# Patient Record
Sex: Male | Born: 1995
Health system: Southern US, Community
[De-identification: ages and names within clinical notes are randomized; demographics above are authoritative.]

## PROBLEM LIST (undated history)

## (undated) DIAGNOSIS — F909 Attention-deficit hyperactivity disorder, unspecified type: Secondary | ICD-10-CM

## (undated) HISTORY — DX: Attention-deficit hyperactivity disorder, unspecified type: F90.9

---
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
## 2014-01-04 ENCOUNTER — Telehealth: Payer: Self-pay | Admitting: Nurse Practitioner

## 2014-01-04 ENCOUNTER — Encounter: Payer: Self-pay | Admitting: *Deleted

## 2014-01-04 NOTE — Telephone Encounter (Signed)
Spoke with pt's mother regarding RX Verbalizes understanding

## 2014-01-31 ENCOUNTER — Telehealth: Payer: Self-pay | Admitting: Nurse Practitioner

## 2014-01-31 DIAGNOSIS — F988 Other specified behavioral and emotional disorders with onset usually occurring in childhood and adolescence: Secondary | ICD-10-CM

## 2014-02-01 MED ORDER — LISDEXAMFETAMINE DIMESYLATE 60 MG PO CAPS: 60.0000 mg | ORAL_CAPSULE | ORAL | Status: DC

## 2014-02-01 NOTE — Telephone Encounter (Signed)
vyvanse rx ready for pick up  

## 2014-02-01 NOTE — Telephone Encounter (Signed)
Pt aware.

## 2014-03-04 ENCOUNTER — Telehealth: Payer: Self-pay | Admitting: Nurse Practitioner

## 2014-03-04 DIAGNOSIS — F988 Other specified behavioral and emotional disorders with onset usually occurring in childhood and adolescence: Secondary | ICD-10-CM

## 2014-03-04 MED ORDER — LISDEXAMFETAMINE DIMESYLATE 60 MG PO CAPS: 60.0000 mg | ORAL_CAPSULE | ORAL | Status: DC

## 2014-03-04 NOTE — Telephone Encounter (Signed)
vyvanse rx ready for pick up  

## 2014-03-04 NOTE — Telephone Encounter (Signed)
Pt aware.

## 2014-03-05 ENCOUNTER — Telehealth: Payer: Self-pay | Admitting: Nurse Practitioner

## 2014-03-05 NOTE — Telephone Encounter (Signed)
Would rather not do adderall- would rather try concerta

## 2014-03-05 NOTE — Telephone Encounter (Signed)
Insurance plan changed and vyvance cost $235.  It had been $35.00 before.    Could you change to generic Adderall or anything that would work but not cost so much.   We would appreciate your help.  ([facility])

## 2014-03-06 MED ORDER — METHYLPHENIDATE HCL ER (OSM) 54 MG PO TBCR: 54.0000 mg | EXTENDED_RELEASE_TABLET | ORAL | Status: DC

## 2014-03-06 NOTE — Telephone Encounter (Signed)
Concerta should be fine. Can you write prescription?

## 2014-03-06 NOTE — Telephone Encounter (Signed)
concerta rx ready for pick up  

## 2014-03-29 ENCOUNTER — Ambulatory Visit (INDEPENDENT_AMBULATORY_CARE_PROVIDER_SITE_OTHER): Payer: BLUE CROSS/BLUE SHIELD | Admitting: Nurse Practitioner

## 2014-03-29 ENCOUNTER — Encounter: Payer: Self-pay | Admitting: Nurse Practitioner

## 2014-03-29 VITALS — BP 134/75 | HR 84 | Temp 98.0°F | Ht 71.0 in | Wt 207.0 lb

## 2014-03-29 DIAGNOSIS — J0120 Acute ethmoidal sinusitis, unspecified: Secondary | ICD-10-CM

## 2014-03-29 MED ORDER — AZITHROMYCIN 250 MG PO TABS: ORAL_TABLET | ORAL | Status: DC

## 2014-03-29 NOTE — Patient Instructions (Addendum)

## 2014-03-29 NOTE — Progress Notes (Signed)
   Subjective:    Patient ID: Laqueta DueBenjamin R Gillen, male    DOB: 06-Feb-1996, 19 y.o.   MRN: [id number]  Sinus Problem This is a new problem. The current episode started in the past 7 days. The problem has been gradually worsening since onset. There has been no fever. Pain severity now: pressure over the sinuses.  Associated symptoms include congestion, coughing, sinus pressure and a sore throat. Treatments tried: NYQUILL, DAYQUILL, zyrtec D.  The treatment provided no relief.  Cough Associated symptoms include postnasal drip, rhinorrhea and a sore throat.     Review of Systems  Constitutional: Negative.   HENT: Positive for congestion, postnasal drip, rhinorrhea, sinus pressure and sore throat.   Eyes: Negative.   Respiratory: Positive for cough.   Endocrine: Negative.   Genitourinary: Negative.   Musculoskeletal: Negative.   Allergic/Immunologic: Negative.   Neurological: Negative.   Hematological: Negative.   Psychiatric/Behavioral: Negative.        Objective:   Physical Exam  Constitutional: He is oriented to person, place, and time. He appears well-developed and well-nourished.  HENT:  Head: Normocephalic.  Eyes: Pupils are equal, round, and reactive to light.  Neck: Normal range of motion.  Cardiovascular: Normal rate and regular rhythm.   Pulmonary/Chest: Effort normal and breath sounds normal.  Musculoskeletal: Normal range of motion.  Neurological: He is alert and oriented to person, place, and time.  Skin: Skin is warm.  Psychiatric: He has a normal mood and affect.    BP 134/75 mmHg  Pulse 84  Temp(Src) 98 F (36.7 C) (Oral)  Ht 5\' 11"  (1.803 m)  Wt 207 lb (93.895 kg)  BMI 28.88 kg/m2       Assessment & Plan:   Acute ethmoidal sinusitis, recurrence not specified - Plan: azithromycin (ZITHROMAX) 250 MG tablet  Meds ordered this encounter  Medications  . azithromycin (ZITHROMAX) 250 MG tablet    Sig: USE AS DIRECTED    Dispense:  6 tablet    Refill:   0     1. Take meds as prescribed 2. Use a cool mist humidifier especially during the winter months and when heat has been humid. 3. Use saline nose sprays frequently 4. Saline irrigations of the nose can be very helpful if done frequently.  * 4X daily for 1 week*  * Use of a nettie pot can be helpful with this. Follow directions with this* 5. Drink plenty of fluids 6. Keep thermostat turn down low 7.For any cough or congestion  Use plain Mucinex- regular strength or max strength is fine   * Children- consult with Pharmacist for dosing 8. For fever or aces or pains- take tylenol or ibuprofen appropriate for age and weight.  * for fevers greater than 101 orally you may alternate ibuprofen and tylenol every  3 hours.    Mary-Margaret Daphine DeutscherMartin, FNP

## 2014-10-21 ENCOUNTER — Telehealth: Payer: Self-pay | Admitting: Nurse Practitioner

## 2014-10-21 NOTE — Telephone Encounter (Signed)
Mother aware that we do not have any record of tdap.

## 2014-10-25 ENCOUNTER — Encounter: Payer: Self-pay | Admitting: Family Medicine

## 2014-10-25 ENCOUNTER — Ambulatory Visit (INDEPENDENT_AMBULATORY_CARE_PROVIDER_SITE_OTHER): Payer: BLUE CROSS/BLUE SHIELD | Admitting: Family Medicine

## 2014-10-25 VITALS — BP 126/67 | HR 96 | Temp 97.8°F | Ht 71.0 in | Wt 185.4 lb

## 2014-10-25 DIAGNOSIS — Z23 Encounter for immunization: Secondary | ICD-10-CM | POA: Diagnosis not present

## 2014-10-25 DIAGNOSIS — F32A Depression, unspecified: Secondary | ICD-10-CM

## 2014-10-25 DIAGNOSIS — F329 Major depressive disorder, single episode, unspecified: Secondary | ICD-10-CM

## 2014-10-25 DIAGNOSIS — F339 Major depressive disorder, recurrent, unspecified: Secondary | ICD-10-CM | POA: Insufficient documentation

## 2014-10-25 MED ORDER — SERTRALINE HCL 50 MG PO TABS: 50.0000 mg | ORAL_TABLET | Freq: Every day | ORAL | Status: DC

## 2014-10-25 NOTE — Progress Notes (Signed)
BP 126/67 mmHg  Pulse 96  Temp(Src) 97.8 F (36.6 C) (Oral)  Ht  (1.803 m)  Wt 185 lb 6.4 oz (84.097 kg)  BMI 25.87 kg/m2   Subjective:    Patient ID: Marco George, male    DOB: August 31, 1995, 19 y.o.   MRN: [id number]  HPI: Marco George is a 19 y.o. male presenting on 10/25/2014 for Depression   HPI Anxiety/depression Patient presents today with feelings of depression and sadness and difficulty concentrating but have been increasing since he started. Sleep Issues:  Yes Interests and hobbies decrease: Yes Guilt:  Yes Energy Loss:  Yes Concentration difficulties:  Yes Appetite changes:  Yes Psychomotor Retardation:  Yes Suicidal Ideations:  No  Has there ever been a period of time when you were not your usual self and ... - you felt so good or so hyper that other people thought you were not your normal self or you were so hyper that you got into trouble?  No - you were so irritable that you shouted at people or started fights or arguments? No - you felt much more self-confident than usual? No - you got much less sleep than usual and found that you didn't really miss it? No - you were more talkative or spoke much faster than usual? No - thoughts raced through your head or you couldn't slow your mind down? Yes - you were so easily distracted by things around you that you had trouble concentrating or stay on track? Yes -you had much more energy than usual? No - you were much more active or did many more things than usual? No - you were much more social or outgoing than usual, for example, you telephoned friends in the middle of the night? No -you were much more interested in sex than usual? No -you did things that were unusual for you or that other people might have thought were excessive, foolish or risky? No -spending money got you or your family in trouble? No    If you checked yes for more than one of the above, have several of these ever happened during  the same period of time? No  How much of a problem did any of these cause you- like being able to work; having family, money or legal troubles; getting in arguments or fights? No major issues with these problems.  Have any of your blood relatives had manic-depressive illness or bipolar disorder?  No   Has a health professional ever told you that you have manic-depressive illness or bipolar disorder? No                               Relevant past medical, surgical, family and social history reviewed and updated as indicated. Interim medical history since our last visit reviewed. Allergies and medications reviewed and updated.  Review of Systems  Constitutional: Negative for fever and chills.  HENT: Negative for ear discharge and ear pain.   Eyes: Negative for discharge and visual disturbance.  Respiratory: Negative for shortness of breath and wheezing.   Cardiovascular: Negative for chest pain, palpitations and leg swelling.  Gastrointestinal: Negative for abdominal pain, diarrhea and constipation.  Genitourinary: Negative for difficulty urinating.  Musculoskeletal: Negative for back pain and gait problem.  Skin: Negative for rash.  Neurological: Negative for dizziness, syncope, weakness, light-headedness and headaches.  Psychiatric/Behavioral: Positive for dysphoric mood, decreased concentration and agitation. Negative for suicidal ideas,  hallucinations and confusion. The patient is nervous/anxious.   All other systems reviewed and are negative.   Per HPI unless specifically indicated above     Medication List       This list is accurate as of: 10/25/14 10:23 AM.  Always use your most recent med list.               sertraline 50 MG tablet  Commonly known as:  ZOLOFT  Take 1 tablet (50 mg total) by mouth daily.           Objective:    BP 126/67 mmHg  Pulse 96  Temp(Src) 97.8 F (36.6 C) (Oral)  Ht 5\' 11"  (1.803 m)  Wt 185 lb 6.4 oz (84.097 kg)  BMI 25.87 kg/m2    Wt Readings from Last 3 Encounters:  10/25/14 185 lb 6.4 oz (84.097 kg)  03/29/14 207 lb (93.895 kg) (94 %*, Z = 1.56)  12/07/13 187 lb 12.8 oz (85.186 kg) (87 %*, Z = 1.11)   * Growth percentiles are based on CDC 2-20 Years data.    Physical Exam  Constitutional: He is oriented to person, place, and time. He appears well-developed and well-nourished. No distress.  Eyes: Conjunctivae and EOM are normal. Pupils are equal, round, and reactive to light. Right eye exhibits no discharge. No scleral icterus.  Neck: Neck supple. No thyromegaly present.  Cardiovascular: Normal rate, regular rhythm, normal heart sounds and intact distal pulses.   No murmur heard. Pulmonary/Chest: Effort normal and breath sounds normal. No respiratory distress. He has no wheezes.  Abdominal: He exhibits no distension.  Musculoskeletal: Normal range of motion. He exhibits no edema.  Lymphadenopathy:    He has no cervical adenopathy.  Neurological: He is alert and oriented to person, place, and time. Coordination normal.  Skin: Skin is warm and dry. No rash noted. He is not diaphoretic.  Psychiatric: His speech is normal and behavior is normal. Judgment and thought content normal. His mood appears anxious. His affect is labile. His affect is not angry, not blunt and not inappropriate. Cognition and memory are normal. He exhibits a depressed mood. He expresses no suicidal ideation. He expresses no suicidal plans.  Vitals reviewed.   Results for orders placed or performed during the hospital encounter of 09/30/06  Rapid strep screen  Result Value Ref Range   Streptococcus, Group A Screen (Direct) NEGATIVE   Urinalysis, Routine w reflex microscopic  Result Value Ref Range   Color, Urine YELLOW    APPearance CLEAR    Specific Gravity, Urine 1.024    pH 6.0    Glucose, UA NEGATIVE    Hgb urine dipstick NEGATIVE    Bilirubin Urine NEGATIVE    Ketones, ur NEGATIVE    Protein, ur >300 (A)    Urobilinogen, UA 0.2     Nitrite NEGATIVE    Leukocytes, UA NEGATIVE   Urine microscopic-add on  Result Value Ref Range   WBC, UA 0-2    RBC / HPF 0-2    Bacteria, UA RARE    Casts HYALINE CASTS GRANULAR CAST (A)    Urine-Other MUCOUS PRESENT   CBC  Result Value Ref Range   WBC 9.1    RBC 5.67 (H)    Hemoglobin 16.5 (H)    HCT 47.1 (H)    MCV 83.1    MCHC 35.0 (H)    RDW 12.1    Platelets 351   Comprehensive metabolic panel  Result Value Ref Range  Sodium 137    Potassium 3.6    Chloride 100    CO2 26    Glucose, Bld 93    BUN 12    Creatinine, Ser 0.67    Calcium 10.3    Total Protein 8.2    Albumin 4.6    AST 25    ALT 26    Alkaline Phosphatase 214    Total Bilirubin 0.7    GFR calc non Af Amer NOT CALCULATED    GFR calc Af Amer      NOT CALCULATED        The eGFR has been calculated using the MDRD equation. This calculation has not been validated in all clinical  Differential  Result Value Ref Range   Neutrophils Relative % 63    Neutro Abs 5.7    Lymphocytes Relative 26 (L)    Lymphs Abs 2.3    Monocytes Relative 10 (H)    Monocytes Absolute 0.9    Eosinophils Relative 0    Eosinophils Absolute 0.0    Basophils Relative 0    Basophils Absolute 0.0       Assessment & Plan:   Problem List Items Addressed This Visit      Other   Acute depression - Primary    Patient has acute depression or stress reaction or adjustment disorder. We will treat with Zoloft and recommended him to see a counselor which she is restarted to see down in [place]. Gave number for suicide crisis line here in [facility] and recommended her him to find the same number up in [facility] or call the national hotline which is online.      Relevant Medications   sertraline (ZOLOFT) 50 MG tablet   Other Relevant Orders   CBC with Differential/Platelet   Thyroid Panel With TSH (Completed)   Vit D  25 hydroxy (rtn osteoporosis monitoring) (Completed)       Follow up plan: Return in about 4  weeks (around 11/22/2014), or if symptoms worsen or fail to improve.  Caryl Pina, MD [facility] 10/25/2014, 10:23 AM

## 2014-10-25 NOTE — Assessment & Plan Note (Signed)
Patient has acute depression or stress reaction or adjustment disorder. We will treat with Zoloft and recommended him to see a counselor which she is restarted to see down in [place]. Gave number for suicide crisis line here in [facility] and recommended her him to find the same number up in [facility] or call the national hotline which is online.

## 2014-10-25 NOTE — Patient Instructions (Signed)

## 2014-10-26 LAB — CBC WITH DIFFERENTIAL/PLATELET
BASOS ABS: 0 10*3/uL (ref 0.0–0.2)
BASOS: 1 %
EOS (ABSOLUTE): 0 10*3/uL (ref 0.0–0.4)
Eos: 0 %
HEMOGLOBIN: 15.8 g/dL (ref 12.6–17.7)
Hematocrit: 46 % (ref 37.5–51.0)
IMMATURE GRANS (ABS): 0 10*3/uL (ref 0.0–0.1)
Immature Granulocytes: 0 %
LYMPHS: 30 %
Lymphocytes Absolute: 2.5 10*3/uL (ref 0.7–3.1)
MCH: 29.8 pg (ref 26.6–33.0)
MCHC: 34.3 g/dL (ref 31.5–35.7)
MCV: 87 fL (ref 79–97)
MONOCYTES: 8 %
Monocytes Absolute: 0.6 10*3/uL (ref 0.1–0.9)
NEUTROS ABS: 5.1 10*3/uL (ref 1.4–7.0)
Neutrophils: 61 %
Platelets: 361 10*3/uL (ref 150–379)
RBC: 5.31 x10E6/uL (ref 4.14–5.80)
RDW: 12.7 % (ref 12.3–15.4)
WBC: 8.4 10*3/uL (ref 3.4–10.8)

## 2014-10-26 LAB — THYROID PANEL WITH TSH
Free Thyroxine Index: 2.6 (ref 1.2–4.9)
T3 Uptake Ratio: 29 % (ref 24–39)
T4, Total: 9 ug/dL (ref 4.5–12.0)
TSH: 1.71 u[IU]/mL (ref 0.450–4.500)

## 2014-10-26 LAB — VITAMIN D 25 HYDROXY (VIT D DEFICIENCY, FRACTURES): VIT D 25 HYDROXY: 38.4 ng/mL (ref 30.0–100.0)

## 2014-11-15 ENCOUNTER — Encounter: Payer: Self-pay | Admitting: Family Medicine

## 2014-11-15 ENCOUNTER — Ambulatory Visit (INDEPENDENT_AMBULATORY_CARE_PROVIDER_SITE_OTHER): Payer: BLUE CROSS/BLUE SHIELD | Admitting: Family Medicine

## 2014-11-15 VITALS — BP 122/75 | HR 84 | Temp 97.5°F | Ht 71.0 in | Wt 198.0 lb

## 2014-11-15 DIAGNOSIS — F329 Major depressive disorder, single episode, unspecified: Secondary | ICD-10-CM | POA: Diagnosis not present

## 2014-11-15 DIAGNOSIS — F32A Depression, unspecified: Secondary | ICD-10-CM

## 2014-11-15 MED ORDER — SERTRALINE HCL 50 MG PO TABS
50.0000 mg | ORAL_TABLET | Freq: Every day | ORAL | Status: DC
Start: 2014-11-15 — End: 2016-09-08

## 2014-11-15 NOTE — Progress Notes (Signed)
BP 122/75 mmHg  Pulse 84  Temp(Src) 97.5 F (36.4 C) (Oral)  Ht  (1.803 m)  Wt 198 lb (89.812 kg)  BMI 27.63 kg/m2   Subjective:    Patient ID: Marco George, male    DOB: May 02, 1995, 19 y.o.   MRN: [id number]  HPI: Marco George is a 19 y.o. male presenting on 11/15/2014 for Depression   HPI Depression Patient presents for a depression recheck. He said it took about 2 weeks but the medication has been doing great for him. He feels like he can get out and exercise and is doing a lot better in school. He also feels like he is able to get out and make friends better. His mother is also with him today and she said it took about 2 weeks but it's night and day that he is feeling so much better. He does feel like his appetite is increased and we discussed that happening with the Zoloft and that he just needs to watch that. He describes his emotions as 8 out of 10 and how good he is feeling.  Relevant past medical, surgical, family and social history reviewed and updated as indicated. Interim medical history since our last visit reviewed. Allergies and medications reviewed and updated.  Review of Systems  Constitutional: Negative for fever and chills.  HENT: Negative for ear discharge and ear pain.   Eyes: Negative for discharge and visual disturbance.  Respiratory: Negative for shortness of breath and wheezing.   Cardiovascular: Negative for chest pain, palpitations and leg swelling.  Gastrointestinal: Negative for abdominal pain, diarrhea and constipation.  Genitourinary: Negative for difficulty urinating.  Musculoskeletal: Negative for back pain and gait problem.  Skin: Negative for rash.  Neurological: Negative for dizziness, syncope, weakness, light-headedness and headaches.  Psychiatric/Behavioral: Positive for dysphoric mood (much improved). Negative for suicidal ideas, hallucinations, confusion, decreased concentration and agitation. The patient is not  nervous/anxious.   All other systems reviewed and are negative.   Per HPI unless specifically indicated above     Medication List       This list is accurate as of: 11/15/14 11:39 AM.  Always use your most recent med list.               sertraline 50 MG tablet  Commonly known as:  ZOLOFT  Take 1 tablet (50 mg total) by mouth daily.           Objective:    BP 122/75 mmHg  Pulse 84  Temp(Src) 97.5 F (36.4 C) (Oral)  Ht  (1.803 m)  Wt 198 lb (89.812 kg)  BMI 27.63 kg/m2  Wt Readings from Last 3 Encounters:  11/15/14 198 lb (89.812 kg)  10/25/14 185 lb 6.4 oz (84.097 kg)  03/29/14 207 lb (93.895 kg) (94 %*, Z = 1.56)   * Growth percentiles are based on CDC 2-20 Years data.    Physical Exam  Constitutional: He is oriented to person, place, and time. He appears well-developed and well-nourished. No distress.  Eyes: Conjunctivae and EOM are normal. Pupils are equal, round, and reactive to light. Right eye exhibits no discharge. No scleral icterus.  Cardiovascular: Normal rate, regular rhythm, normal heart sounds and intact distal pulses.   No murmur heard. Pulmonary/Chest: Effort normal and breath sounds normal. No respiratory distress. He has no wheezes.  Abdominal: He exhibits no distension.  Musculoskeletal: Normal range of motion. He exhibits no edema.  Neurological: He is alert and oriented to person,  place, and time. Coordination normal.  Skin: Skin is warm and dry. No rash noted. He is not diaphoretic.  Psychiatric: His speech is normal and behavior is normal. Judgment and thought content normal. His mood appears anxious (Much improved). His affect is not blunt and not labile. Cognition and memory are normal. He exhibits a depressed mood (much improved). He expresses no suicidal ideation. He expresses no suicidal plans.  Vitals reviewed.   Results for orders placed or performed in visit on 10/25/14  Thyroid Panel With TSH  Result Value Ref Range   TSH  1.710 0.450 - 4.500 uIU/mL   T4, Total 9.0 4.5 - 12.0 ug/dL   T3 Uptake Ratio 29 24 - 39 %   Free Thyroxine Index 2.6 1.2 - 4.9  Vit D  25 hydroxy (rtn osteoporosis monitoring)  Result Value Ref Range   Vit D, 25-Hydroxy 38.4 30.0 - 100.0 ng/mL  CBC with Differential/Platelet  Result Value Ref Range   WBC 8.4 3.4 - 10.8 x10E3/uL   RBC 5.31 4.14 - 5.80 x10E6/uL   Hemoglobin 15.8 12.6 - 17.7 g/dL   Hematocrit 06.3 01.6 - 51.0 %   MCV 87 79 - 97 fL   MCH 29.8 26.6 - 33.0 pg   MCHC 34.3 31.5 - 35.7 g/dL   RDW 01.0 93.2 - 35.5 %   Platelets 361 150 - 379 x10E3/uL   Neutrophils 61 %   Lymphs 30 %   Monocytes 8 %   Eos 0 %   Basos 1 %   Neutrophils Absolute 5.1 1.4 - 7.0 x10E3/uL   Lymphocytes Absolute 2.5 0.7 - 3.1 x10E3/uL   Monocytes Absolute 0.6 0.1 - 0.9 x10E3/uL   EOS (ABSOLUTE) 0.0 0.0 - 0.4 x10E3/uL   Basophils Absolute 0.0 0.0 - 0.2 x10E3/uL   Immature Granulocytes 0 %   Immature Grans (Abs) 0.0 0.0 - 0.1 x10E3/uL      Assessment & Plan:   Problem List Items Addressed This Visit      Other   Acute depression - Primary    Depression recheck, is on the Zoloft 50 mg and feels like he is doing very well on it. He is doing a lot better in school and with friends and has started exercising. We'll continue medication and see him back in 4 months.      Relevant Medications   sertraline (ZOLOFT) 50 MG tablet       Follow up plan: Return in about 4 months (around 03/17/2015), or if symptoms worsen or fail to improve.  Arville Care, MD [facility] 11/15/2014, 11:39 AM

## 2014-11-15 NOTE — Patient Instructions (Signed)

## 2014-11-15 NOTE — Assessment & Plan Note (Signed)
Depression recheck, is on the Zoloft 50 mg and feels like he is doing very well on it. He is doing a lot better in school and with friends and has started exercising. We'll continue medication and see him back in 4 months.

## 2014-12-04 ENCOUNTER — Encounter: Payer: Self-pay | Admitting: *Deleted

## 2015-03-04 ENCOUNTER — Ambulatory Visit: Payer: BLUE CROSS/BLUE SHIELD | Admitting: Family Medicine

## 2015-07-20 DIAGNOSIS — T148 Other injury of unspecified body region: Secondary | ICD-10-CM | POA: Diagnosis not present

## 2015-07-20 DIAGNOSIS — S6992XA Unspecified injury of left wrist, hand and finger(s), initial encounter: Secondary | ICD-10-CM | POA: Diagnosis not present

## 2016-02-05 ENCOUNTER — Ambulatory Visit (INDEPENDENT_AMBULATORY_CARE_PROVIDER_SITE_OTHER): Payer: BLUE CROSS/BLUE SHIELD | Admitting: Family Medicine

## 2016-02-05 ENCOUNTER — Encounter: Payer: Self-pay | Admitting: Family Medicine

## 2016-02-05 VITALS — BP 101/74 | HR 98 | Temp 98.2°F | Ht 71.0 in | Wt 204.2 lb

## 2016-02-05 DIAGNOSIS — J019 Acute sinusitis, unspecified: Secondary | ICD-10-CM

## 2016-02-05 MED ORDER — AZITHROMYCIN 250 MG PO TABS: ORAL_TABLET | ORAL | 0 refills | Status: DC

## 2016-02-05 NOTE — Progress Notes (Signed)
BP 101/74   Pulse 98   Temp 98.2 F (36.8 C) (Oral)   Ht 5\' 11"  (1.803 m)   Wt 204 lb 4 oz (92.6 kg)   BMI 28.49 kg/m    Subjective:    Patient ID: Marco George, male    DOB: 1995/04/08, 20 y.o.   MRN: [id number]  HPI: Marco George is a 20 y.o. male presenting on 02/05/2016 for Sinusitis (sinus congestion, pressure and drainage; symptoms began 4 days ago; taking Mucinex, Zyrtec, Nyquil, Dayquil, Tyenol); Headache; and Cough   HPI Cough and congestion and sinus pressure and postnasal drainage Patient has been having cough and congestion and sinus drainage and postnasal drainage is been going on for the past 3 days. He has been using Zyrtec and Mucinex and DayQuil and Tylenol. He has cough that is productive of yellow-green sputum. He feels like he has had some low-grade fevers and has a sinus headache. He he thinks he may have had some wheezing but denies shortness of breath. He does not have a history of asthma. He feels like it has worsened over the past few days.  Relevant past medical, surgical, family and social history reviewed and updated as indicated. Interim medical history since our last visit reviewed. Allergies and medications reviewed and updated.  Review of Systems  Constitutional: Negative for chills and fever.  HENT: Positive for congestion, postnasal drip, rhinorrhea, sinus pressure, sneezing and sore throat. Negative for ear discharge, ear pain and voice change.   Eyes: Negative for pain, discharge, redness and visual disturbance.  Respiratory: Positive for cough. Negative for shortness of breath and wheezing.   Cardiovascular: Negative for chest pain and leg swelling.  Musculoskeletal: Negative for gait problem.  Skin: Negative for rash.  All other systems reviewed and are negative.   Per HPI unless specifically indicated above     Medication List       Accurate as of 02/05/16  8:44 AM. Always use your most recent med list.            azithromycin 250 MG tablet Commonly known as:  ZITHROMAX Take 2 the first day and then one each day after.   sertraline 50 MG tablet Commonly known as:  ZOLOFT Take 1 tablet (50 mg total) by mouth daily.          Objective:    BP 101/74   Pulse 98   Temp 98.2 F (36.8 C) (Oral)   Ht 5\' 11"  (1.803 m)   Wt 204 lb 4 oz (92.6 kg)   BMI 28.49 kg/m   Wt Readings from Last 3 Encounters:  02/05/16 204 lb 4 oz (92.6 kg)  11/15/14 198 lb (89.8 kg)  10/25/14 185 lb 6.4 oz (84.1 kg)    Physical Exam  Constitutional: He is oriented to person, place, and time. He appears well-developed and well-nourished. No distress.  HENT:  Right Ear: Tympanic membrane, external ear and ear canal normal.  Left Ear: Tympanic membrane, external ear and ear canal normal.  Nose: Mucosal edema and rhinorrhea present. No sinus tenderness. No epistaxis. Right sinus exhibits maxillary sinus tenderness and frontal sinus tenderness. Left sinus exhibits maxillary sinus tenderness and frontal sinus tenderness.  Mouth/Throat: Uvula is midline and mucous membranes are normal. Posterior oropharyngeal edema and posterior oropharyngeal erythema present. No oropharyngeal exudate or tonsillar abscesses.  Eyes: Conjunctivae are normal. Right eye exhibits no discharge. Left eye exhibits no discharge. No scleral icterus.  Neck: Neck supple. No thyromegaly present.  Cardiovascular: Normal rate, regular rhythm, normal heart sounds and intact distal pulses.   No murmur heard. Pulmonary/Chest: Effort normal and breath sounds normal. No respiratory distress. He has no wheezes. He has no rales.  Musculoskeletal: Normal range of motion. He exhibits no edema.  Lymphadenopathy:    He has no cervical adenopathy.  Neurological: He is alert and oriented to person, place, and time. Coordination normal.  Skin: Skin is warm and dry. No rash noted. He is not diaphoretic.  Psychiatric: He has a normal mood and affect. His behavior is  normal.  Nursing note and vitals reviewed.     Assessment & Plan:   Problem List Items Addressed This Visit    None    Visit Diagnoses    Acute rhinosinusitis    -  Primary   Recommended continuing the Mucinex and Zyrtec and take the Flonase over the next few days. If not improved or worsens then pick up Z-Pak   Relevant Medications   azithromycin (ZITHROMAX) 250 MG tablet       Follow up plan: Return if symptoms worsen or fail to improve.  Counseling provided for all of the vaccine components No orders of the defined types were placed in this encounter.   Arville Care, MD [facility] 02/05/2016, 8:44 AM

## 2016-03-31 DIAGNOSIS — K529 Noninfective gastroenteritis and colitis, unspecified: Secondary | ICD-10-CM | POA: Diagnosis not present

## 2016-09-08 ENCOUNTER — Ambulatory Visit (INDEPENDENT_AMBULATORY_CARE_PROVIDER_SITE_OTHER): Payer: BLUE CROSS/BLUE SHIELD | Admitting: Family Medicine

## 2016-09-08 ENCOUNTER — Encounter: Payer: Self-pay | Admitting: Family Medicine

## 2016-09-08 VITALS — BP 123/69 | HR 78 | Temp 98.5°F | Ht 70.0 in | Wt 193.0 lb

## 2016-09-08 DIAGNOSIS — Z0000 Encounter for general adult medical examination without abnormal findings: Secondary | ICD-10-CM | POA: Diagnosis not present

## 2016-09-08 LAB — URINALYSIS
BILIRUBIN UA: NEGATIVE
Glucose, UA: NEGATIVE
Ketones, UA: NEGATIVE
Leukocytes, UA: NEGATIVE
Nitrite, UA: NEGATIVE
PH UA: 7 (ref 5.0–7.5)
Protein, UA: NEGATIVE
RBC, UA: NEGATIVE
Specific Gravity, UA: 1.015 (ref 1.005–1.030)
UUROB: 1 mg/dL (ref 0.2–1.0)

## 2016-09-08 LAB — HEMOGLOBIN, FINGERSTICK: HEMOGLOBIN: 16.4 g/dL (ref 12.6–17.7)

## 2016-09-08 NOTE — Progress Notes (Signed)
BP 123/69   Pulse 78   Temp 98.5 F (36.9 C) (Oral)   Ht 5\' 10"  (1.778 m)   Wt 193 lb (87.5 kg)   BMI 27.69 kg/m    Subjective:    Patient ID: Marco DueBenjamin R Alwine, male    DOB: 04-06-1995, 21 y.o.   MRN: [id number]  HPI: Marco George is a 21 y.o. 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 He denies any major health issues. He does note slightly overweight and has been working on that and has lost 20 pounds over the past year. Patient denies any chest pain, shortness of breath, headaches or vision issues, abdominal complaints, diarrhea, nausea, vomiting, or joint issues.   Relevant past medical, surgical, family and social history reviewed and updated as indicated. Interim medical history since our last visit reviewed. Allergies and medications reviewed and updated.  Review of Systems  Constitutional: Negative for chills and fever.  HENT: Negative for ear pain and tinnitus.   Eyes: Negative for pain and discharge.  Respiratory: Negative for cough, shortness of breath and wheezing.   Cardiovascular: Negative for chest pain, palpitations and leg swelling.  Gastrointestinal: Negative for abdominal pain, blood in stool, constipation and diarrhea.  Genitourinary: Negative for dysuria and hematuria.  Musculoskeletal: Negative for back pain, gait problem and myalgias.  Skin: Negative for rash.  Neurological: Negative for dizziness, weakness and headaches.  Psychiatric/Behavioral: Negative for suicidal ideas.  All other systems reviewed and are negative.   Per HPI unless specifically indicated above   Allergies as of 09/08/2016   No Known Allergies     Medication List    as of 09/08/2016  2:43 PM   You have not been prescribed any medications.        Objective:    BP 123/69    Pulse 78   Temp 98.5 F (36.9 C) (Oral)   Ht 5\' 10"  (1.778 m)   Wt 193 lb (87.5 kg)   BMI 27.69 kg/m   Wt Readings from Last 3 Encounters:  09/08/16 193 lb (87.5 kg)  02/05/16 204 lb 4 oz (92.6 kg)  11/15/14 198 lb (89.8 kg)    Physical Exam  Constitutional: He is oriented to person, place, and time. He appears well-developed and well-nourished. No distress.  HENT:  Right Ear: External ear normal.  Left Ear: External ear normal.  Nose: Nose normal.  Mouth/Throat: Oropharynx is clear and moist. No oropharyngeal exudate.  Eyes: Pupils are equal, round, and reactive to light. Conjunctivae and EOM are normal. Right eye exhibits no discharge. No scleral icterus.  Neck: Neck supple. No thyromegaly present.  Cardiovascular: Normal rate, regular rhythm, normal heart sounds and intact distal pulses.   No murmur heard. Pulmonary/Chest: Effort normal and breath sounds normal. No respiratory distress. He has no wheezes.  Abdominal: Soft. Bowel sounds are normal. He exhibits no distension. There is no tenderness. There is no rebound and no guarding.  Genitourinary:  Genitourinary Comments: Patient declined exam stating he does not have any problems today  Musculoskeletal: Normal range of motion. He exhibits no edema.  Lymphadenopathy:    He has no cervical adenopathy.  Neurological: He is alert and oriented to person, place, and time. Coordination normal.  Skin: Skin is warm and dry. No rash noted. He is not diaphoretic.  Psychiatric: He has a normal mood and affect. His behavior is normal.  Nursing note and vitals reviewed.  Urinalysis:Normal, no glucose or blood  POC Hb: 16.4     Assessment & Plan:   Problem List Items Addressed This Visit    None    Visit Diagnoses    Well adult exam    -  Primary   Required by College physical examination form.   Relevant Orders   Urinalysis   Hemoglobin, fingerstick       Follow up plan: Return in about 1 year (around 09/08/2017), or if  symptoms worsen or fail to improve.  Counseling provided for all of the vaccine components Orders Placed This Encounter  Procedures  . Urinalysis  . Hemoglobin, fingerstick    Arville Care, MD [facility] 09/08/2016, 2:43 PM

## 2019-03-12 ENCOUNTER — Encounter: Payer: Self-pay | Admitting: Physician Assistant

## 2019-03-12 ENCOUNTER — Ambulatory Visit (INDEPENDENT_AMBULATORY_CARE_PROVIDER_SITE_OTHER): Payer: BC Managed Care – PPO | Admitting: Physician Assistant

## 2019-03-12 DIAGNOSIS — U071 COVID-19: Secondary | ICD-10-CM

## 2019-03-12 MED ORDER — FLUTICASONE PROPIONATE 50 MCG/ACT NA SUSP: 2.0000 | Freq: Every day | NASAL | 6 refills | Status: DC

## 2019-03-12 MED ORDER — BENZONATATE 200 MG PO CAPS: 200.0000 mg | ORAL_CAPSULE | Freq: Three times a day (TID) | ORAL | 0 refills | Status: DC | PRN

## 2019-03-12 NOTE — Progress Notes (Signed)
10 min 544      Telephone visit  Subjective: TZ:GYFVC infection PCP: Dettinger, Elige Radon, MD BSW:HQPRFFMB Marco George is a 24 y.o. male calls for telephone consult today. Patient provides verbal consent for consult held via phone.  Patient is identified with 2 separate identifiers.  At this time the entire area is on COVID-19 social distancing and stay home orders are in place.  Patient is of higher risk and therefore we are performing this by a virtual method.  Location of patient: home Location of provider: [facility] Others present for call: mother  The patient has been diagnosed with Covid.  He is running a fever and trying to rest.  Mom states that he is isolating himself in the basement.    Patient with several days of progressing upper respiratory and bronchial symptoms. Initially there was more upper respiratory congestion. This progressed to having significant cough that is productive throughout the day and severe at night. There is occasional wheezing after coughing. Sometimes there is slight dyspnea on exertion.   ROS: Per HPI  No Known Allergies Past Medical History:  Diagnosis Date  . ADHD (attention deficit hyperactivity disorder)   . ADHD (attention deficit hyperactivity disorder)     Current Outpatient Medications:  .  benzonatate (TESSALON) 200 MG capsule, Take 1 capsule (200 mg total) by mouth 3 (three) times daily as needed for cough., Disp: 30 capsule, Rfl: 0 .  fluticasone (FLONASE) 50 MCG/ACT nasal spray, Place 2 sprays into both nostrils daily., Disp: 16 g, Rfl: 6  Assessment/ Plan: 24 y.o. male   1. COVID-19 virus infection - benzonatate (TESSALON) 200 MG capsule; Take 1 capsule (200 mg total) by mouth 3 (three) times daily as needed for cough.  Dispense: 30 capsule; Refill: 0 - fluticasone (FLONASE) 50 MCG/ACT nasal spray; Place 2 sprays into both nostrils daily.  Dispense: 16 g; Refill: 6    No follow-ups on file.  Continue all other maintenance  medications as listed above.  Start time: 5:20 PM End time: 5:31 PM  Meds ordered this encounter  Medications  . benzonatate (TESSALON) 200 MG capsule    Sig: Take 1 capsule (200 mg total) by mouth 3 (three) times daily as needed for cough.    Dispense:  30 capsule    Refill:  0    Hold until patient calls to get    Order Specific Question:   Supervising Provider    Answer:   Raliegh Ip [[id number]]  . fluticasone (FLONASE) 50 MCG/ACT nasal spray    Sig: Place 2 sprays into both nostrils daily.    Dispense:  16 g    Refill:  6    Order Specific Question:   Supervising Provider    Answer:   Raliegh Ip [[id number]]    Prudy Feeler PA-C [facility] [phone]

## 2019-03-15 ENCOUNTER — Encounter: Payer: Self-pay | Admitting: Physician Assistant

## 2019-03-16 ENCOUNTER — Ambulatory Visit (INDEPENDENT_AMBULATORY_CARE_PROVIDER_SITE_OTHER): Payer: BC Managed Care – PPO | Admitting: Family Medicine

## 2019-03-16 DIAGNOSIS — U071 COVID-19: Secondary | ICD-10-CM

## 2019-03-16 DIAGNOSIS — J40 Bronchitis, not specified as acute or chronic: Secondary | ICD-10-CM

## 2019-03-16 DIAGNOSIS — J329 Chronic sinusitis, unspecified: Secondary | ICD-10-CM | POA: Diagnosis not present

## 2019-03-16 MED ORDER — AMOXICILLIN-POT CLAVULANATE 875-125 MG PO TABS: 1.0000 | ORAL_TABLET | Freq: Two times a day (BID) | ORAL | 0 refills | Status: DC

## 2019-03-16 MED ORDER — PREDNISONE 10 MG PO TABS: ORAL_TABLET | ORAL | 0 refills | Status: DC

## 2019-03-16 MED ORDER — HYDROCODONE-HOMATROPINE 5-1.5 MG/5ML PO SYRP: 5.0000 mL | ORAL_SOLUTION | Freq: Four times a day (QID) | ORAL | 0 refills | Status: AC | PRN

## 2019-03-16 NOTE — Progress Notes (Signed)
Subjective:    Patient ID: Marco George, male    DOB: Dec 18, 1995, 24 y.o.   MRN: [id number]   HPI: Marco George is a 24 y.o. male presenting for ill for 4 days. CoVID positive. Using OTC remedies. Not getting any relief. Head and chest congestion. Denies dyspnea. Chest tight yesterday. Sinuses are congested with yellow rhino that is thick. Fever early on, but not the last two days. Tessalon not helping cough.   Depression screen [facility] 2/9 09/08/2016 02/05/2016 11/15/2014 10/25/2014  Decreased Interest 0 0 0 3  Down, Depressed, Hopeless 0 0 1 3  PHQ - 2 Score 0 0 1 6  Altered sleeping - - - 3  Tired, decreased energy - - - 3  Change in appetite - - - 3  Feeling bad or failure about yourself  - - - 3  Trouble concentrating - - - 3  Moving slowly or fidgety/restless - - - 3  Suicidal thoughts - - - 0  PHQ-9 Score - - - 24     Relevant past medical, surgical, family and social history reviewed and updated as indicated.  Interim medical history since our last visit reviewed. Allergies and medications reviewed and updated.  ROS:  Review of Systems  Constitutional: Negative for activity change, appetite change, chills and fever.  HENT: Positive for congestion, postnasal drip, rhinorrhea and sinus pressure. Negative for ear discharge, ear pain, hearing loss, nosebleeds, sneezing and trouble swallowing.   Respiratory: Negative for chest tightness and shortness of breath.   Cardiovascular: Negative for chest pain and palpitations.  Musculoskeletal: Positive for myalgias.  Skin: Negative for rash.     Social History   Tobacco Use  Smoking Status Never Smoker  Smokeless Tobacco Never Used       Objective:     Wt Readings from Last 3 Encounters:  09/08/16 193 lb (87.5 kg)  02/05/16 204 lb 4 oz (92.6 kg)  11/15/14 198 lb (89.8 kg)     Exam deferred. Pt. Harboring due to COVID 19. Phone visit performed.   Assessment & Plan:   1. Sinobronchitis   2. COVID-19  virus infection     Meds ordered this encounter  Medications  . amoxicillin-clavulanate (AUGMENTIN) 875-125 MG tablet    Sig: Take 1 tablet by mouth 2 (two) times daily. Take all of this medication    Dispense:  20 tablet    Refill:  0  . predniSONE (DELTASONE) 10 MG tablet    Sig: Take 5 daily for 2 days followed by 4,3,2 and 1 for 2 days each.    Dispense:  30 tablet    Refill:  0  . HYDROcodone-homatropine (HYCODAN) 5-1.5 MG/5ML syrup    Sig: Take 5 mLs by mouth every 6 (six) hours as needed for up to 5 days for cough.    Dispense:  100 mL    Refill:  0    No orders of the defined types were placed in this encounter.     Diagnoses and all orders for this visit:  Sinobronchitis  COVID-19 virus infection  Other orders -     amoxicillin-clavulanate (AUGMENTIN) 875-125 MG tablet; Take 1 tablet by mouth 2 (two) times daily. Take all of this medication -     predniSONE (DELTASONE) 10 MG tablet; Take 5 daily for 2 days followed by 4,3,2 and 1 for 2 days each. -     HYDROcodone-homatropine (HYCODAN) 5-1.5 MG/5ML syrup; Take 5 mLs by mouth every 6 (six)  hours as needed for up to 5 days for cough.    Virtual Visit via telephone Note  I discussed the limitations, risks, security and privacy concerns of performing an evaluation and management service by telephone and the availability of in person appointments. The patient was identified with two identifiers. Pt.expressed understanding and agreed to proceed. Pt. Is at home. Dr. Livia Snellen is in his office.  Follow Up Instructions:   I discussed the assessment and treatment plan with the patient. The patient was provided an opportunity to ask questions and all were answered. The patient agreed with the plan and demonstrated an understanding of the instructions.   The patient was advised to call back or seek an in-person evaluation if the symptoms worsen or if the condition fails to improve as anticipated.   Total minutes including  chart review and phone contact time: 14   Follow up plan: No follow-ups on file.  Claretta Fraise, MD [place]

## 2019-03-17 ENCOUNTER — Encounter: Payer: Self-pay | Admitting: Family Medicine

## 2019-10-08 DIAGNOSIS — Z20828 Contact with and (suspected) exposure to other viral communicable diseases: Secondary | ICD-10-CM | POA: Diagnosis not present

## 2019-10-25 DIAGNOSIS — B354 Tinea corporis: Secondary | ICD-10-CM | POA: Diagnosis not present

## 2019-10-29 DIAGNOSIS — L03115 Cellulitis of right lower limb: Secondary | ICD-10-CM | POA: Diagnosis not present

## 2019-10-29 DIAGNOSIS — M79671 Pain in right foot: Secondary | ICD-10-CM | POA: Diagnosis not present

## 2019-11-14 ENCOUNTER — Telehealth (INDEPENDENT_AMBULATORY_CARE_PROVIDER_SITE_OTHER): Payer: BC Managed Care – PPO | Admitting: Family Medicine

## 2019-11-14 ENCOUNTER — Encounter: Payer: Self-pay | Admitting: Family Medicine

## 2019-11-14 DIAGNOSIS — F339 Major depressive disorder, recurrent, unspecified: Secondary | ICD-10-CM

## 2019-11-14 MED ORDER — SERTRALINE HCL 50 MG PO TABS: 50.0000 mg | ORAL_TABLET | Freq: Every day | ORAL | 3 refills | Status: DC

## 2019-11-14 NOTE — Progress Notes (Signed)
Virtual Visit via telephone Note  I connected with Marco George on 11/14/19 at [phone] by telephone and verified that I am speaking with the correct person using two identifiers. [name] is currently located at home and patient are currently with her during visit. The provider, Elige Radon Tiran Sauseda, MD is located in their office at time of visit.  Call ended at [phone]  I discussed the limitations, risks, security and privacy concerns of performing an evaluation and management service by telephone and the availability of in person appointments. I also discussed with the patient that there may be a patient responsible charge related to this service. The patient expressed understanding and agreed to proceed.   History and Present Illness: Patient is calling in for anxiety and depression and is having a lot of stress and anxiety. He is having a lot of stress from work. He started having problems 2-3 months ago.  He had some anxiety attacks.  He is having trouble sleeping and mind is racing and can't calm his mind down.  Appetite has been up and down. He denies any suicidal ideations.    No diagnosis found.  Outpatient Encounter Medications as of 11/14/2019  Medication Sig  . amoxicillin-clavulanate (AUGMENTIN) 875-125 MG tablet Take 1 tablet by mouth 2 (two) times daily. Take all of this medication  . benzonatate (TESSALON) 200 MG capsule Take 1 capsule (200 mg total) by mouth 3 (three) times daily as needed for cough.  . fluticasone (FLONASE) 50 MCG/ACT nasal spray Place 2 sprays into both nostrils daily.  . predniSONE (DELTASONE) 10 MG tablet Take 5 daily for 2 days followed by 4,3,2 and 1 for 2 days each.   No facility-administered encounter medications on file as of 11/14/2019.    Review of Systems  Constitutional: Negative for chills and fever.  Eyes: Negative for visual disturbance.  Respiratory: Negative for shortness of breath and wheezing.   Cardiovascular: Negative for  chest pain and leg swelling.  Musculoskeletal: Negative for back pain and gait problem.  Skin: Negative for rash.  Psychiatric/Behavioral: Positive for dysphoric mood. Negative for confusion, decreased concentration, self-injury, sleep disturbance and suicidal ideas. The patient is nervous/anxious.   All other systems reviewed and are negative.   Observations/Objective: Patient sounds comfortable and in no acute distress  Assessment and Plan: Problem List Items Addressed This Visit      Other   Depression, recurrent (HCC) - Primary   Relevant Medications   sertraline (ZOLOFT) 50 MG tablet      Will start patient on Zoloft, follow-up in 1 month with physical and recheck. Follow up plan: Return in about 4 weeks (around 12/12/2019), or if symptoms worsen or fail to improve, for depression.     I discussed the assessment and treatment plan with the patient. The patient was provided an opportunity to ask questions and all were answered. The patient agreed with the plan and demonstrated an understanding of the instructions.   The patient was advised to call back or seek an in-person evaluation if the symptoms worsen or if the condition fails to improve as anticipated.  The above assessment and management plan was discussed with the patient. The patient verbalized understanding of and has agreed to the management plan. Patient is aware to call the clinic if symptoms persist or worsen. Patient is aware when to return to the clinic for a follow-up visit. Patient educated on when it is appropriate to go to the emergency department.    I  provided 10 minutes of non-face-to-face time during this encounter.    Nils Pyle, MD

## 2019-12-19 ENCOUNTER — Telehealth: Payer: Self-pay | Admitting: Family Medicine

## 2019-12-21 ENCOUNTER — Other Ambulatory Visit: Payer: Self-pay

## 2019-12-21 ENCOUNTER — Ambulatory Visit (INDEPENDENT_AMBULATORY_CARE_PROVIDER_SITE_OTHER): Payer: BC Managed Care – PPO | Admitting: Family Medicine

## 2019-12-21 ENCOUNTER — Encounter: Payer: Self-pay | Admitting: Family Medicine

## 2019-12-21 VITALS — BP 117/75 | HR 93 | Temp 97.0°F | Ht 70.0 in | Wt 191.0 lb

## 2019-12-21 DIAGNOSIS — Z0001 Encounter for general adult medical examination with abnormal findings: Secondary | ICD-10-CM | POA: Diagnosis not present

## 2019-12-21 DIAGNOSIS — Z0000 Encounter for general adult medical examination without abnormal findings: Secondary | ICD-10-CM | POA: Diagnosis not present

## 2019-12-21 DIAGNOSIS — Z1159 Encounter for screening for other viral diseases: Secondary | ICD-10-CM

## 2019-12-21 DIAGNOSIS — Z114 Encounter for screening for human immunodeficiency virus [HIV]: Secondary | ICD-10-CM

## 2019-12-21 DIAGNOSIS — F339 Major depressive disorder, recurrent, unspecified: Secondary | ICD-10-CM | POA: Diagnosis not present

## 2019-12-21 MED ORDER — SERTRALINE HCL 50 MG PO TABS: 50.0000 mg | ORAL_TABLET | Freq: Every day | ORAL | 3 refills | Status: DC

## 2019-12-21 NOTE — Progress Notes (Signed)
BP 117/75   Pulse 93   Temp (!) 97 F (36.1 C)   Ht $R'5\' 10"'WK$  (1.778 m)   Wt 191 lb (86.6 kg)   SpO2 97%   BMI 27.41 kg/m    Subjective:   Patient ID: Marco George, male    DOB: Jan 23, 1996, 24 y.o.   MRN: [id number]  HPI: Marco George is a 24 y.o. male presenting on 12/21/2019 for Medical Management of Chronic Issues (CPE) and Depression   HPI Adult well exam and physical Patient is coming in today for adult well exam and physical and recheck for depression.  He says he is doing well on the Zoloft and feels like it is helping and wants to continue forward with that.  He feels like otherwise help things are going very well. Patient denies any chest pain, shortness of breath, headaches or vision issues, abdominal complaints, diarrhea, nausea, vomiting, or joint issues.   Relevant past medical, surgical, family and social history reviewed and updated as indicated. Interim medical history since our last visit reviewed. Allergies and medications reviewed and updated.  Review of Systems  Constitutional: Negative for chills and fever.  Eyes: Negative for visual disturbance.  Respiratory: Negative for shortness of breath and wheezing.   Cardiovascular: Negative for chest pain and leg swelling.  Musculoskeletal: Negative for back pain and gait problem.  Skin: Negative for rash.  Neurological: Negative for dizziness, weakness and light-headedness.  All other systems reviewed and are negative.   Per HPI unless specifically indicated above   Allergies as of 12/21/2019   No Known Allergies     Medication List       Accurate as of December 21, 2019 10:10 AM. If you have any questions, ask your nurse or doctor.        sertraline 50 MG tablet Commonly known as: ZOLOFT Take 1 tablet (50 mg total) by mouth daily.        Objective:   BP 117/75   Pulse 93   Temp (!) 97 F (36.1 C)   Ht $R'5\' 10"'yp$  (1.778 m)   Wt 191 lb (86.6 kg)   SpO2 97%   BMI 27.41 kg/m   Wt  Readings from Last 3 Encounters:  12/21/19 191 lb (86.6 kg)  09/08/16 193 lb (87.5 kg)  02/05/16 204 lb 4 oz (92.6 kg)    Physical Exam Vitals and nursing note reviewed.  Constitutional:      General: He is not in acute distress.    Appearance: He is well-developed. He is not diaphoretic.  Eyes:     General: No scleral icterus.       Right eye: No discharge.     Conjunctiva/sclera: Conjunctivae normal.     Pupils: Pupils are equal, round, and reactive to light.  Neck:     Thyroid: No thyromegaly.  Cardiovascular:     Rate and Rhythm: Normal rate and regular rhythm.     Heart sounds: Normal heart sounds. No murmur heard.   Pulmonary:     Effort: Pulmonary effort is normal. No respiratory distress.     Breath sounds: Normal breath sounds. No wheezing.  Musculoskeletal:        General: Normal range of motion.     Cervical back: Neck supple.  Lymphadenopathy:     Cervical: No cervical adenopathy.  Skin:    General: Skin is warm and dry.     Findings: No rash.  Neurological:     Mental Status: He is  alert and oriented to person, place, and time.     Coordination: Coordination normal.  Psychiatric:        Behavior: Behavior normal.       Assessment & Plan:   Problem List Items Addressed This Visit      Other   Depression, recurrent ([place])   Relevant Medications   sertraline (ZOLOFT) 50 MG tablet   Other Relevant Orders   CBC with Differential/Platelet   TSH    Other Visit Diagnoses    Well adult exam    -  Primary   Relevant Orders   CBC with Differential/Platelet   CMP14+EGFR   Lipid panel   TSH   Encounter for screening for HIV       Relevant Orders   HIV Antibody (routine testing w rflx)   Need for hepatitis C screening test       Relevant Orders   Hepatitis C antibody      Doing well on current medication for depression, continue. Follow up plan: Return in about 1 year (around 12/20/2020), or if symptoms worsen or fail to improve, for Well adult  exam.  Counseling provided for all of the vaccine components Orders Placed This Encounter  Procedures  . CBC with Differential/Platelet  . CMP14+EGFR  . Lipid panel  . TSH  . Hepatitis C antibody  . HIV Antibody (routine testing w rflx)    Caryl Pina, MD [place] [facility] 12/21/2019, 10:10 AM

## 2019-12-22 LAB — CMP14+EGFR
ALT: 38 IU/L (ref 0–44)
AST: 23 IU/L (ref 0–40)
Albumin/Globulin Ratio: 2.4 — ABNORMAL HIGH (ref 1.2–2.2)
Albumin: 4.7 g/dL (ref 4.1–5.2)
Alkaline Phosphatase: 61 IU/L (ref 44–121)
BUN/Creatinine Ratio: 14 (ref 9–20)
BUN: 14 mg/dL (ref 6–20)
Bilirubin Total: 0.5 mg/dL (ref 0.0–1.2)
CO2: 24 mmol/L (ref 20–29)
Calcium: 10.3 mg/dL — ABNORMAL HIGH (ref 8.7–10.2)
Chloride: 101 mmol/L (ref 96–106)
Creatinine, Ser: 1.03 mg/dL (ref 0.76–1.27)
GFR calc Af Amer: 116 mL/min/{1.73_m2} (ref >59)
GFR calc non Af Amer: 100 mL/min/{1.73_m2} (ref >59)
Globulin, Total: 2 g/dL (ref 1.5–4.5)
Glucose: 76 mg/dL (ref 65–99)
Potassium: 4.6 mmol/L (ref 3.5–5.2)
Sodium: 139 mmol/L (ref 134–144)
Total Protein: 6.7 g/dL (ref 6.0–8.5)

## 2019-12-22 LAB — CBC WITH DIFFERENTIAL/PLATELET
Basophils Absolute: 0 10*3/uL (ref 0.0–0.2)
Basos: 1 %
EOS (ABSOLUTE): 0.1 10*3/uL (ref 0.0–0.4)
Eos: 1 %
Hematocrit: 46.6 % (ref 37.5–51.0)
Hemoglobin: 15.8 g/dL (ref 13.0–17.7)
Immature Grans (Abs): 0 10*3/uL (ref 0.0–0.1)
Immature Granulocytes: 0 %
Lymphocytes Absolute: 2.2 10*3/uL (ref 0.7–3.1)
Lymphs: 38 %
MCH: 29.7 pg (ref 26.6–33.0)
MCHC: 33.9 g/dL (ref 31.5–35.7)
MCV: 88 fL (ref 79–97)
Monocytes Absolute: 0.6 10*3/uL (ref 0.1–0.9)
Monocytes: 11 %
Neutrophils Absolute: 2.9 10*3/uL (ref 1.4–7.0)
Neutrophils: 49 %
Platelets: 221 10*3/uL (ref 150–450)
RBC: 5.32 x10E6/uL (ref 4.14–5.80)
RDW: 11.6 % (ref 11.6–15.4)
WBC: 5.9 10*3/uL (ref 3.4–10.8)

## 2019-12-22 LAB — HEPATITIS C ANTIBODY: Hep C Virus Ab: 0.1 s/co ratio (ref 0.0–0.9)

## 2019-12-22 LAB — HIV ANTIBODY (ROUTINE TESTING W REFLEX): HIV Screen 4th Generation wRfx: NONREACTIVE

## 2019-12-22 LAB — LIPID PANEL
Chol/HDL Ratio: 5 ratio (ref 0.0–5.0)
Cholesterol, Total: 222 mg/dL — ABNORMAL HIGH (ref 100–199)
HDL: 44 mg/dL (ref >39)
LDL Chol Calc (NIH): 162 mg/dL — ABNORMAL HIGH (ref 0–99)
Triglycerides: 89 mg/dL (ref 0–149)
VLDL Cholesterol Cal: 16 mg/dL (ref 5–40)

## 2019-12-22 LAB — TSH: TSH: 1.89 u[IU]/mL (ref 0.450–4.500)

## 2021-03-20 ENCOUNTER — Other Ambulatory Visit: Payer: Self-pay | Admitting: Family Medicine

## 2021-03-20 DIAGNOSIS — F339 Major depressive disorder, recurrent, unspecified: Secondary | ICD-10-CM

## 2021-07-13 ENCOUNTER — Telehealth: Payer: Self-pay | Admitting: Family Medicine

## 2021-07-13 ENCOUNTER — Other Ambulatory Visit: Payer: Self-pay | Admitting: Family Medicine

## 2021-07-13 DIAGNOSIS — F339 Major depressive disorder, recurrent, unspecified: Secondary | ICD-10-CM

## 2021-07-13 NOTE — Telephone Encounter (Signed)
  Prescription Request  07/13/2021  Is this a "Controlled Substance" medicine? NO  Have you seen your PCP in the last 2 weeks? NO APPT is on 08/19/21 with PCP  If YES, route message to pool  -  If NO, patient needs to be scheduled for appointment.  What is the name of the medication or equipment? sertraline (ZOLOFT) 50 MG tablet   Have you contacted your pharmacy to request a refill? YES   Which pharmacy would you like this sent to? [facility]   Patient notified that their request is being sent to the clinical staff for review and that they should receive a response within 2 business days.

## 2021-07-14 NOTE — Telephone Encounter (Signed)
Pt is aware medication can not be refilled since he has not been seen since 11/2019 He would like sooner appt to discuss zoloft

## 2021-07-14 NOTE — Telephone Encounter (Signed)
Left message for pt to return call. Can schedule on same day or dod if needed.

## 2021-07-14 NOTE — Telephone Encounter (Signed)
Pt has not been seen since 11/2019. We cannot refill the medication.   Tried calling pt. He did not answer and vmail is full.  Can offer a sooner appt to discuss Zoloft only.

## 2021-07-21 ENCOUNTER — Encounter (HOSPITAL_BASED_OUTPATIENT_CLINIC_OR_DEPARTMENT_OTHER): Payer: Self-pay | Admitting: Emergency Medicine

## 2021-07-21 ENCOUNTER — Other Ambulatory Visit: Payer: Self-pay

## 2021-07-21 ENCOUNTER — Emergency Department (HOSPITAL_BASED_OUTPATIENT_CLINIC_OR_DEPARTMENT_OTHER)
Admission: EM | Admit: 2021-07-21 | Discharge: 2021-07-21 | Disposition: A | Discharge status: Home / Self Care | Payer: Self-pay | Attending: Emergency Medicine | Admitting: Emergency Medicine

## 2021-07-21 DIAGNOSIS — R112 Nausea with vomiting, unspecified: Secondary | ICD-10-CM | POA: Insufficient documentation

## 2021-07-21 DIAGNOSIS — R1030 Lower abdominal pain, unspecified: Secondary | ICD-10-CM

## 2021-07-21 DIAGNOSIS — K5900 Constipation, unspecified: Secondary | ICD-10-CM | POA: Insufficient documentation

## 2021-07-21 DIAGNOSIS — R197 Diarrhea, unspecified: Secondary | ICD-10-CM | POA: Insufficient documentation

## 2021-07-21 LAB — CBC
HCT: 45.9 % (ref 39.0–52.0)
Hemoglobin: 15.5 g/dL (ref 13.0–17.0)
MCH: 30.2 pg (ref 26.0–34.0)
MCHC: 33.8 g/dL (ref 30.0–36.0)
MCV: 89.3 fL (ref 80.0–100.0)
Platelets: 191 10*3/uL (ref 150–400)
RBC: 5.14 MIL/uL (ref 4.22–5.81)
RDW: 11.4 % — ABNORMAL LOW (ref 11.5–15.5)
WBC: 9 10*3/uL (ref 4.0–10.5)
nRBC: 0 % (ref 0.0–0.2)

## 2021-07-21 LAB — COMPREHENSIVE METABOLIC PANEL
ALT: 27 U/L (ref 0–44)
AST: 23 U/L (ref 15–41)
Albumin: 4.4 g/dL (ref 3.5–5.0)
Alkaline Phosphatase: 52 U/L (ref 38–126)
Anion gap: 11 (ref 5–15)
BUN: 12 mg/dL (ref 6–20)
CO2: 27 mmol/L (ref 22–32)
Calcium: 9.7 mg/dL (ref 8.9–10.3)
Chloride: 101 mmol/L (ref 98–111)
Creatinine, Ser: 1.05 mg/dL (ref 0.61–1.24)
GFR, Estimated: >60 mL/min (ref >60)
Glucose, Bld: 97 mg/dL (ref 70–99)
Potassium: 4.3 mmol/L (ref 3.5–5.1)
Sodium: 139 mmol/L (ref 135–145)
Total Bilirubin: 0.5 mg/dL (ref 0.3–1.2)
Total Protein: 7.6 g/dL (ref 6.5–8.1)

## 2021-07-21 LAB — URINALYSIS, ROUTINE W REFLEX MICROSCOPIC
Bilirubin Urine: NEGATIVE
Glucose, UA: NEGATIVE mg/dL
Hgb urine dipstick: NEGATIVE
Ketones, ur: NEGATIVE mg/dL
Leukocytes,Ua: NEGATIVE
Nitrite: NEGATIVE
Specific Gravity, Urine: 1.027 (ref 1.005–1.030)
pH: 6.5 (ref 5.0–8.0)

## 2021-07-21 LAB — LIPASE, BLOOD: Lipase: <10 U/L — ABNORMAL LOW (ref 11–51)

## 2021-07-21 MED ORDER — ONDANSETRON HCL 4 MG/2ML IJ SOLN
4.0000 mg | Freq: Once | INTRAMUSCULAR | Status: AC
  Administered 2021-07-21: 4 mg via INTRAVENOUS
  Filled 2021-07-21: qty 2

## 2021-07-21 MED ORDER — FENTANYL CITRATE PF 50 MCG/ML IJ SOSY
50.0000 ug | PREFILLED_SYRINGE | Freq: Once | INTRAMUSCULAR | Status: AC
  Administered 2021-07-21: 50 ug via INTRAVENOUS
  Filled 2021-07-21: qty 1

## 2021-07-21 MED ORDER — ONDANSETRON 4 MG PO TBDP: 4.0000 mg | ORAL_TABLET | Freq: Three times a day (TID) | ORAL | 0 refills | Status: AC | PRN

## 2021-07-21 MED ORDER — LACTATED RINGERS IV BOLUS
1000.0000 mL | Freq: Once | INTRAVENOUS | Status: AC
  Administered 2021-07-21: 1000 mL via INTRAVENOUS

## 2021-07-21 NOTE — ED Provider Notes (Addendum)
[facility] EMERGENCY DEPT Provider Note   CSN: [id number] Arrival date & time: 07/21/21  2203     History  Chief Complaint  Patient presents with   Abdominal Pain    Marco George is a 26 y.o. male.   Abdominal Pain Associated symptoms: no fever   Patient presents with abdominal pain.  Started Sunday with today being Tuesday.  Worsening today.  Has had nausea vomiting diarrhea.  Now feels a little constipated.  No fevers.  Pain is worse in the lower abdomen.  Initially was somewhat diffuse.  Has not had pains like this before.  No known sick contacts.   Past Medical History:  Diagnosis Date   ADHD (attention deficit hyperactivity disorder)    ADHD (attention deficit hyperactivity disorder)    History reviewed. No pertinent surgical history.  Home Medications Prior to Admission medications   Medication Sig Start Date End Date Taking? Authorizing Provider  ondansetron (ZOFRAN-ODT) 4 MG disintegrating tablet Take 1 tablet (4 mg total) by mouth every 8 (eight) hours as needed for nausea or vomiting. 07/21/21  Yes Benjiman Core, MD  sertraline (ZOLOFT) 50 MG tablet Take 1 tablet (50 mg total) by mouth daily. 12/21/19   Dettinger, Elige Radon, MD      Allergies    Patient has no known allergies.    Review of Systems   Review of Systems  Constitutional:  Negative for fever.  Gastrointestinal:  Positive for abdominal pain.   Physical Exam Updated Vital Signs BP 127/87 (BP Location: Left Arm)   Pulse 100   Temp 98.4 F (36.9 C)   Resp 18   Ht 5\' 11"  (1.803 m)   Wt 77.1 kg   SpO2 99%   BMI 23.71 kg/m  Physical Exam Vitals and nursing note reviewed.  Abdominal:     Comments: Mild lower abdominal tenderness without rebound or guarding.  No hernia palpated  Neurological:     Mental Status: He is alert.    ED Results / Procedures / Treatments   Labs (all labs ordered are listed, but only abnormal results are displayed) Labs Reviewed  LIPASE,  BLOOD - Abnormal; Notable for the following components:      Result Value   Lipase <10 (*)    All other components within normal limits  CBC - Abnormal; Notable for the following components:   RDW 11.4 (*)    All other components within normal limits  URINALYSIS, ROUTINE W REFLEX MICROSCOPIC - Abnormal; Notable for the following components:   Protein, ur TRACE (*)    All other components within normal limits  COMPREHENSIVE METABOLIC PANEL    EKG None  Radiology No results found.  Procedures Procedures    Medications Ordered in ED Medications  fentaNYL (SUBLIMAZE) injection 50 mcg (50 mcg Intravenous Given 07/21/21 2247)  ondansetron (ZOFRAN) injection 4 mg (4 mg Intravenous Given 07/21/21 2245)  lactated ringers bolus 1,000 mL (1,000 mLs Intravenous New Bag/Given 07/21/21 2238)    ED Course/ Medical Decision Making/ A&P                           Medical Decision Making Amount and/or Complexity of Data Reviewed Labs: ordered.  Risk Prescription drug management.  Patient presents with abdominal pain.  Lower abdomen but somewhat dispute.  Differential diagnosis is long and includes gastroenteritis, appendicitis, colitis.  Benign exam with only minimal tenderness.  Lab work ordered and reassuring.  White count not elevated.  Will give fluids and some pain medicine.  Will give oral trial.  Hopefully patient be feeling better and be able to discharge home.  If continued pain or worsening tenderness may need CT scan but I think will be able to avoid at this time.  On recheck patient feeling better.  Has tolerated some orals.  Will discharge with outpatient follow-up as needed.  Will return for worsening symptoms.      Final Clinical Impression(s) / ED Diagnoses Final diagnoses:  Lower abdominal pain    Rx / DC Orders ED Discharge Orders          Ordered    ondansetron (ZOFRAN-ODT) 4 MG disintegrating tablet  Every 8 hours PRN        07/21/21 2317               Benjiman Core, MD 07/21/21 2317    Benjiman Core, MD 07/21/21 2337

## 2021-07-21 NOTE — ED Triage Notes (Signed)
Patient reports abdominal pain, starting Sunday afternoon, worse today. N/v/d.small amounts of diarrhea and constipation intermittently.

## 2021-07-22 ENCOUNTER — Ambulatory Visit: Payer: BC Managed Care – PPO | Admitting: Family Medicine

## 2021-07-31 ENCOUNTER — Ambulatory Visit (INDEPENDENT_AMBULATORY_CARE_PROVIDER_SITE_OTHER): Payer: PRIVATE HEALTH INSURANCE | Admitting: Family Medicine

## 2021-07-31 ENCOUNTER — Encounter: Payer: Self-pay | Admitting: Family Medicine

## 2021-07-31 DIAGNOSIS — F339 Major depressive disorder, recurrent, unspecified: Secondary | ICD-10-CM

## 2021-07-31 MED ORDER — SERTRALINE HCL 50 MG PO TABS: 50.0000 mg | ORAL_TABLET | Freq: Every day | ORAL | 3 refills | Status: DC

## 2021-07-31 NOTE — Progress Notes (Signed)
BP (!) 117/57   Pulse (!) 57   Temp 98 F (36.7 C)   Ht 5\' 11"  (1.803 m)   Wt 176 lb (79.8 kg)   SpO2 98%   BMI 24.55 kg/m    Subjective:   Patient ID: , male    DOB: 1996-02-01, 26 y.o.   MRN: [id number]  HPI: Marco George is a 26 y.o. male presenting on 07/31/2021 for Medical Management of Chronic Issues and Depression   HPI Depression and anxiety recheck Patient is currently taking Zoloft.  Coming in today for refill.  Patient feels like the Zoloft is doing very well.  He is overwhelmed by his workload sometimes but he does not feel lack of motivation or feeling down or depressed panic attacks and is sleeping well and is eating well and is happy but just overwhelmed at work.    12/21/2019    9:26 AM 09/08/2016    2:20 PM 02/05/2016    8:27 AM 11/15/2014   11:08 AM 10/25/2014    9:41 AM  Depression screen PHQ 2/9  Decreased Interest 0 0 0 0 3  Down, Depressed, Hopeless 0 0 0 1 3  PHQ - 2 Score 0 0 0 1 6  Altered sleeping     3  Tired, decreased energy     3  Change in appetite     3  Feeling bad or failure about yourself      3  Trouble concentrating     3  Moving slowly or fidgety/restless     3  Suicidal thoughts     0  PHQ-9 Score     24     Relevant past medical, surgical, family and social history reviewed and updated as indicated. Interim medical history since our last visit reviewed. Allergies and medications reviewed and updated.  Review of Systems  Constitutional:  Negative for chills and fever.  Eyes:  Negative for visual disturbance.  Respiratory:  Negative for shortness of breath and wheezing.   Cardiovascular:  Negative for chest pain and leg swelling.  Musculoskeletal:  Negative for back pain and gait problem.  Skin:  Negative for rash.  Neurological:  Negative for dizziness, weakness and numbness.  All other systems reviewed and are negative.   Per HPI unless specifically indicated above   Allergies as of 07/31/2021   No  Known Allergies      Medication List        Accurate as of July 31, 2021 10:28 AM. If you have any questions, ask your nurse or doctor.          ondansetron 4 MG disintegrating tablet Commonly known as: ZOFRAN-ODT Take 1 tablet (4 mg total) by mouth every 8 (eight) hours as needed for nausea or vomiting.   sertraline 50 MG tablet Commonly known as: ZOLOFT Take 1 tablet (50 mg total) by mouth daily.         Objective:   BP (!) 117/57   Pulse (!) 57   Temp 98 F (36.7 C)   Ht 5\' 11"  (1.803 m)   Wt 176 lb (79.8 kg)   SpO2 98%   BMI 24.55 kg/m   Wt Readings from Last 3 Encounters:  07/31/21 176 lb (79.8 kg)  07/21/21 170 lb (77.1 kg)  12/21/19 191 lb (86.6 kg)    Physical Exam Vitals and nursing note reviewed.  Constitutional:      General: He is not in acute distress.  Appearance: He is well-developed. He is not diaphoretic.  Eyes:     General: No scleral icterus.    Conjunctiva/sclera: Conjunctivae normal.  Neck:     Thyroid: No thyromegaly.  Cardiovascular:     Rate and Rhythm: Normal rate and regular rhythm.     Heart sounds: Normal heart sounds. No murmur heard. Pulmonary:     Effort: Pulmonary effort is normal. No respiratory distress.     Breath sounds: Normal breath sounds. No wheezing.  Musculoskeletal:        General: Normal range of motion.     Cervical back: Neck supple.  Lymphadenopathy:     Cervical: No cervical adenopathy.  Skin:    General: Skin is warm and dry.     Findings: No rash.  Neurological:     Mental Status: He is alert and oriented to person, place, and time.     Coordination: Coordination normal.  Psychiatric:        Behavior: Behavior normal.       Assessment & Plan:   Problem List Items Addressed This Visit       Other   Depression, recurrent (HCC)   Relevant Medications   sertraline (ZOLOFT) 50 MG tablet  Continue sertraline, patient physical at work and he will get [place] the results, he says he gets a  physical through his insurance at work every year.  Follow up plan: Return if symptoms worsen or fail to improve.  Counseling provided for all of the vaccine components No orders of the defined types were placed in this encounter.   Arville Care, MD [facility] 07/31/2021, 10:28 AM

## 2021-08-05 NOTE — Telephone Encounter (Signed)
Pt was seen 07/31/21, will close encounter.

## 2021-08-19 ENCOUNTER — Ambulatory Visit: Payer: BC Managed Care – PPO | Admitting: Family Medicine

## 2022-07-20 ENCOUNTER — Other Ambulatory Visit: Payer: Self-pay | Admitting: Family Medicine

## 2022-07-20 DIAGNOSIS — F339 Major depressive disorder, recurrent, unspecified: Secondary | ICD-10-CM

## 2022-10-04 ENCOUNTER — Other Ambulatory Visit: Payer: Self-pay | Admitting: Family Medicine

## 2022-10-04 DIAGNOSIS — F339 Major depressive disorder, recurrent, unspecified: Secondary | ICD-10-CM

## 2022-10-04 MED ORDER — SERTRALINE HCL 50 MG PO TABS: 50.0000 mg | ORAL_TABLET | Freq: Every day | ORAL | 0 refills | Status: DC

## 2022-10-04 NOTE — Telephone Encounter (Signed)
Pt scheduled appt to see Dr Dettinger on 11/26/2022 (first available) for med refill. Needs refill sent in to last him until this appt. Aware that if he misses this appt, no other refills will be sent in for him.

## 2022-10-04 NOTE — Addendum Note (Signed)
Addended by: Julious Payer D on: 10/04/2022 03:46 PM   Modules accepted: Orders

## 2022-10-04 NOTE — Telephone Encounter (Signed)
Dettinger NTBS Last OV 07/31/21 NO RF will be sent to pharmacy last OV greater than a year

## 2022-11-26 ENCOUNTER — Encounter: Payer: Self-pay | Admitting: Family Medicine

## 2022-11-26 ENCOUNTER — Ambulatory Visit: Payer: PRIVATE HEALTH INSURANCE | Admitting: Family Medicine

## 2022-11-26 DIAGNOSIS — F339 Major depressive disorder, recurrent, unspecified: Secondary | ICD-10-CM | POA: Diagnosis not present

## 2022-11-26 MED ORDER — SERTRALINE HCL 50 MG PO TABS: 50.0000 mg | ORAL_TABLET | Freq: Every day | ORAL | 3 refills | Status: DC

## 2022-11-26 NOTE — Progress Notes (Signed)
BP 114/70   Pulse 65   Ht 5\' 11"  (1.803 m)   Wt 188 lb (85.3 kg)   SpO2 99%   BMI 26.22 kg/m    Subjective:   Patient ID: Marco George, male    DOB: 1995-04-06, 27 y.o.   MRN: [id number]  HPI: Marco George is a 27 y.o. male presenting on 11/26/2022 for Medical Management of Chronic Issues and Depression   HPI Depression recheck Patient is coming in for depression recheck and is currently taking Zoloft.  Patient had been off of it for 2 or 3 weeks and has noticed that he has been a little more anxious and a little more irritable and felt like it was doing very good for him.  He would like to get back on it.     07/31/2021   10:29 AM 12/21/2019    9:26 AM 09/08/2016    2:20 PM 02/05/2016    8:27 AM 11/15/2014   11:08 AM  Depression screen PHQ 2/9  Decreased Interest 0 0 0 0 0  Down, Depressed, Hopeless 0 0 0 0 1  PHQ - 2 Score 0 0 0 0 1  Altered sleeping 1      Tired, decreased energy 1      Change in appetite 0      Feeling bad or failure about yourself  0      Trouble concentrating 0      Moving slowly or fidgety/restless 0      Suicidal thoughts 0      PHQ-9 Score 2      Difficult doing work/chores Not difficult at all         Relevant past medical, surgical, family and social history reviewed and updated as indicated. Interim medical history since our last visit reviewed. Allergies and medications reviewed and updated.  Review of Systems  Constitutional:  Negative for chills and fever.  Eyes:  Negative for visual disturbance.  Respiratory:  Negative for shortness of breath and wheezing.   Cardiovascular:  Negative for chest pain and leg swelling.  Musculoskeletal:  Negative for back pain and gait problem.  Skin:  Negative for rash.  Neurological:  Negative for dizziness, weakness and light-headedness.  All other systems reviewed and are negative.   Per HPI unless specifically indicated above   Allergies as of 11/26/2022   No Known Allergies       Medication List        Accurate as of November 26, 2022  4:07 PM. If you have any questions, ask your nurse or doctor.          ondansetron 4 MG disintegrating tablet Commonly known as: ZOFRAN-ODT Take 1 tablet (4 mg total) by mouth every 8 (eight) hours as needed for nausea or vomiting.   sertraline 50 MG tablet Commonly known as: ZOLOFT Take 1 tablet (50 mg total) by mouth daily. What changed: additional instructions Changed by: Elige Radon Jatavian Calica         Objective:   BP 114/70   Pulse 65   Ht 5\' 11"  (1.803 m)   Wt 188 lb (85.3 kg)   SpO2 99%   BMI 26.22 kg/m   Wt Readings from Last 3 Encounters:  11/26/22 188 lb (85.3 kg)  07/31/21 176 lb (79.8 kg)  07/21/21 170 lb (77.1 kg)    Physical Exam Vitals and nursing note reviewed.  Constitutional:      General: He is not in acute distress.  Appearance: He is well-developed. He is not diaphoretic.  Eyes:     General: No scleral icterus.    Conjunctiva/sclera: Conjunctivae normal.  Neck:     Thyroid: No thyromegaly.  Cardiovascular:     Rate and Rhythm: Normal rate and regular rhythm.     Heart sounds: Normal heart sounds. No murmur heard. Pulmonary:     Effort: Pulmonary effort is normal. No respiratory distress.     Breath sounds: Normal breath sounds. No wheezing.  Musculoskeletal:        General: Normal range of motion.     Cervical back: Neck supple.  Lymphadenopathy:     Cervical: No cervical adenopathy.  Skin:    General: Skin is warm and dry.     Findings: No rash.  Neurological:     Mental Status: He is alert and oriented to person, place, and time.     Coordination: Coordination normal.  Psychiatric:        Behavior: Behavior normal.       Assessment & Plan:   Problem List Items Addressed This Visit       Other   Depression, recurrent (HCC)   Relevant Medications   sertraline (ZOLOFT) 50 MG tablet    Restart Zoloft, seems to be doing well Follow up plan: Return in about  1 year (around 11/26/2023), or if symptoms worsen or fail to improve, for Physical exam and depression recheck.  Counseling provided for all of the vaccine components No orders of the defined types were placed in this encounter.   Arville Care, MD [facility] 11/26/2022, 4:07 PM

## 2023-02-21 ENCOUNTER — Encounter: Payer: Self-pay | Admitting: Podiatry

## 2023-02-21 ENCOUNTER — Ambulatory Visit (INDEPENDENT_AMBULATORY_CARE_PROVIDER_SITE_OTHER): Payer: PRIVATE HEALTH INSURANCE | Admitting: Podiatry

## 2023-02-21 DIAGNOSIS — Q6689 Other  specified congenital deformities of feet: Secondary | ICD-10-CM

## 2023-02-21 DIAGNOSIS — M7751 Other enthesopathy of right foot: Secondary | ICD-10-CM

## 2023-02-21 DIAGNOSIS — Q6652 Congenital pes planus, left foot: Secondary | ICD-10-CM

## 2023-02-21 MED ORDER — MELOXICAM 15 MG PO TABS: 15.0000 mg | ORAL_TABLET | Freq: Every day | ORAL | 0 refills | Status: DC

## 2023-02-21 NOTE — Progress Notes (Signed)
  Subjective:  Patient ID: Marco George, male    DOB: 11/09/1995,   MRN: [id number]  Chief Complaint  Patient presents with   Foot Pain    Pt presents for bil foot pain that has been going on for a while now the pain is more in the ball and the toes right is the worse and he thinks the left started hurting because he puts more pressure on it.    27 y.o. male presents for concern of pain under both of his feet that has been ongoing for a while now and relates most of the pain is in the ball of his foot. Relates most pain when he is on his feet a lot.   He works in [employer] .Denies any current treatments.  Denies any other pedal complaints. Denies n/v/f/c.   Past Medical History:  Diagnosis Date   ADHD (attention deficit hyperactivity disorder)    ADHD (attention deficit hyperactivity disorder)     Objective:  Physical Exam: Vascular: DP/PT pulses 2/4 bilateral. CFT <3 seconds. Normal hair growth on digits. No edema.  Skin. No lacerations or abrasions bilateral feet.  Musculoskeletal: MMT 5/5 bilateral lower extremities in DF, PF, Inversion and Eversion. Deceased ROM in DF of ankle joint. Tender to the second MPJ and pain with ROM of the joint. No pain with metatarsal squeeze. No pain elsewhere on foot. There is ROM of the subtalar joint on right but limited on left no pain to palpation about the left foot. Mild pes planus noted bilateral.  Neurological: Sensation intact to light touch.   Assessment:   1. Capsulitis of toe of right foot   2. Rigid pes planus of left foot   3. Calcaneonavicular bar      Plan:  Patient was evaluated and treated and all questions answered. -Xrays reviewed. Collapse of midfoot arch most prominent on right. Anteater signed noted on left and fibrous type union  and CN bar likely present. Possible on right as well with mild anteater and possible union of calcaneus and navicular.  Discussed capsulitis and inflammation of joint and treatment options  with patient.  Injection deferred today .  Discussed stiff sole shoes and recommend use of metatarsal pads.   Meloxicam provided.  Discussed if pain does not improve may consider PT and/or MRI for further surgical planning.  Patient to return in 6 weeks or sooner if concerns arise.     Louann Sjogren, DPM

## 2023-04-04 ENCOUNTER — Encounter: Payer: Self-pay | Admitting: Podiatry

## 2023-04-04 ENCOUNTER — Ambulatory Visit (INDEPENDENT_AMBULATORY_CARE_PROVIDER_SITE_OTHER): Payer: PRIVATE HEALTH INSURANCE | Admitting: Podiatry

## 2023-04-04 DIAGNOSIS — M7751 Other enthesopathy of right foot: Secondary | ICD-10-CM

## 2023-04-04 MED ORDER — MELOXICAM 15 MG PO TABS: 15.0000 mg | ORAL_TABLET | Freq: Every day | ORAL | 2 refills | Status: AC

## 2023-04-04 NOTE — Progress Notes (Signed)
  Subjective:  Patient ID: Marco George, male    DOB: [birth year],   MRN: [id number]  Chief Complaint  Patient presents with   Foot Pain    28 y.o. male presents for follow-up of capuslitis of his right second toe. Relates doing a lot better. He did get some inserts from [employer] and those have helped. Relates meloxicam  has really helped. . Relates most pain when he is on his feet a lot.   He works in [employer] .Denies any current treatments.  Denies any other pedal complaints. Denies n/v/f/c.   Past Medical History:  Diagnosis Date   ADHD (attention deficit hyperactivity disorder)    ADHD (attention deficit hyperactivity disorder)     Objective:  Physical Exam: Vascular: DP/PT pulses 2/4 bilateral. CFT <3 seconds. Normal hair growth on digits. No edema.  Skin. No lacerations or abrasions bilateral feet.  Musculoskeletal: MMT 5/5 bilateral lower extremities in DF, PF, Inversion and Eversion. Deceased ROM in DF of ankle joint. Minimally tenderender to the second MPJ and pain with ROM of the joint. No pain with metatarsal squeeze. No pain elsewhere on foot. There is ROM of the subtalar joint on right but limited on left no pain to palpation about the left foot. Mild pes planus noted bilateral.  Neurological: Sensation intact to light touch.   Assessment:   1. Capsulitis of toe of right foot       Plan:  Patient was evaluated and treated and all questions answered. -Xrays reviewed. Collapse of midfoot arch most prominent on right. Anteater signed noted on left and fibrous type union  and CN bar likely present. Possible on right as well with mild anteater and possible union of calcaneus and navicular.  Discussed capsulitis and inflammation of joint and treatment options with patient.  Injection deferred today .  Continue inserts and meloxicam  as needed. Refill provided.  Discussed if pain does not improve may consider PT and/or MRI for further surgical planning.  Patient to  return as needed    Jennefer Moats, DPM

## 2023-11-11 ENCOUNTER — Other Ambulatory Visit: Payer: Self-pay | Admitting: Family Medicine

## 2023-11-11 DIAGNOSIS — F339 Major depressive disorder, recurrent, unspecified: Secondary | ICD-10-CM

## 2023-12-02 ENCOUNTER — Encounter: Payer: Self-pay | Admitting: Family Medicine

## 2023-12-02 ENCOUNTER — Ambulatory Visit: Payer: PRIVATE HEALTH INSURANCE | Admitting: Family Medicine

## 2023-12-02 VITALS — BP 118/73 | HR 53 | Ht 71.0 in | Wt 200.0 lb

## 2023-12-02 DIAGNOSIS — Z0000 Encounter for general adult medical examination without abnormal findings: Secondary | ICD-10-CM

## 2023-12-02 DIAGNOSIS — Z0001 Encounter for general adult medical examination with abnormal findings: Secondary | ICD-10-CM

## 2023-12-02 DIAGNOSIS — F339 Major depressive disorder, recurrent, unspecified: Secondary | ICD-10-CM

## 2023-12-02 MED ORDER — SERTRALINE HCL 50 MG PO TABS: 50.0000 mg | ORAL_TABLET | Freq: Every day | ORAL | 3 refills | Status: AC

## 2023-12-02 NOTE — Progress Notes (Signed)
 BP 118/73   Pulse (!) 53   Ht 5' 11 (1.803 m)   Wt 200 lb (90.7 kg)   SpO2 95%   BMI 27.89 kg/m    Subjective:   Patient ID: Marco George, male    DOB: 14-Jan-1996, 28 y.o.   MRN: [id number]  HPI: Marco George is a 28 y.o. male presenting on 12/02/2023 for Medical Management of Chronic Issues and Depression   Discussed the use of AI scribe software for clinical note transcription with the patient, who gave verbal consent to proceed.  History of Present Illness   Marco George is a 28 year old male who presents for an annual physical exam and recheck of his current medications.  Anxiety and mood stability - Currently taking Zoloft  for anxiety - Anxiety symptoms are well-managed with current medication regimen - Mood stability is maintained  Weight management - Recent weight increase - Initiated dietary modifications a few weeks ago, eliminating breads and sodas - Goal is weight loss in preparation for wedding in March  General health maintenance - No current health issues or concerns - Recently underwent employer-sponsored physical examination with blood work; results pending and expected next week        12/02/2023    3:41 PM 07/31/2021   10:29 AM 12/21/2019    9:26 AM 09/08/2016    2:20 PM 02/05/2016    8:27 AM  Depression screen PHQ 2/9  Decreased Interest 0 0 0 0 0  Down, Depressed, Hopeless 1 0 0 0 0  PHQ - 2 Score 1 0 0 0 0  Altered sleeping 0 1     Tired, decreased energy 1 1     Change in appetite 1 0     Feeling bad or failure about yourself  0 0     Trouble concentrating 0 0     Moving slowly or fidgety/restless 0 0     Suicidal thoughts 0 0     PHQ-9 Score 3 2     Difficult doing work/chores Not difficult at all Not difficult at all           Relevant past medical, surgical, family and social history reviewed and updated as indicated. Interim medical history since our last visit reviewed. Allergies and medications reviewed and  updated.  Review of Systems  Constitutional:  Negative for chills and fever.  Eyes:  Negative for visual disturbance.  Respiratory:  Negative for shortness of breath and wheezing.   Cardiovascular:  Negative for chest pain and leg swelling.  Musculoskeletal:  Negative for back pain and gait problem.  Skin:  Negative for rash.  Neurological:  Negative for dizziness and light-headedness.  All other systems reviewed and are negative.   Per HPI unless specifically indicated above   Allergies as of 12/02/2023   No Known Allergies      Medication List        Accurate as of December 02, 2023  3:51 PM. If you have any questions, ask your nurse or doctor.          meloxicam  15 MG tablet Commonly known as: MOBIC  Take 1 tablet (15 mg total) by mouth daily.   ondansetron  4 MG disintegrating tablet Commonly known as: ZOFRAN -ODT Take 1 tablet (4 mg total) by mouth every 8 (eight) hours as needed for nausea or vomiting.   sertraline  50 MG tablet Commonly known as: ZOLOFT  Take 1 tablet (50 mg total) by mouth daily. What changed: how much  to take Changed by: Fonda LABOR Duaa Stelzner         Objective:   BP 118/73   Pulse (!) 53   Ht 5' 11 (1.803 m)   Wt 200 lb (90.7 kg)   SpO2 95%   BMI 27.89 kg/m   Wt Readings from Last 3 Encounters:  12/02/23 200 lb (90.7 kg)  11/26/22 188 lb (85.3 kg)  07/31/21 176 lb (79.8 kg)    Physical Exam Vitals and nursing note reviewed.  Constitutional:      Appearance: He is obese.  Abdominal:     General: Abdomen is flat. Bowel sounds are normal. There is no distension.     Palpations: Abdomen is soft.     Tenderness: There is no abdominal tenderness. There is no guarding or rebound.  Musculoskeletal:        General: No swelling.  Neurological:     Mental Status: He is alert.     Deep Tendon Reflexes: Reflexes normal.    Physical Exam   NECK: Throat normal. CHEST: Lungs clear to auscultation bilaterally. ABDOMEN: No abdominal  lumps, tenderness, hernias, bulges, or masses.         Assessment & Plan:   Problem List Items Addressed This Visit       Other   Depression, recurrent (HCC)   Relevant Medications   sertraline  (ZOLOFT ) 50 MG tablet   Other Visit Diagnoses       Physical exam    -  Primary          Adult Wellness Visit Routine visit with no acute concerns. Discussed healthy lifestyle and future family planning. - Upload blood work results from employer physical to [employer] for review. - Continue healthy diet and lifestyle modifications. - Return for annual wellness visit in one year unless issues arise.  Depression Depression well-managed with Zoloft . Anxiety under control. - Continue Zoloft  as prescribed. - Send refills for Zoloft .          Follow up plan: Return in about 1 year (around 12/01/2024), or if symptoms worsen or fail to improve, for Physical exam.  Counseling provided for all of the vaccine components No orders of the defined types were placed in this encounter.   Fonda Levins, MD [facility] 12/02/2023, 3:51 PM

## 2024-12-05 ENCOUNTER — Encounter: Payer: Self-pay | Admitting: Family Medicine
